# Patient Record
Sex: Male | Born: 1955 | Race: White | Hispanic: No | Marital: Single
Health system: Southern US, Community
[De-identification: ages and names within clinical notes are randomized; demographics above are authoritative.]

---

## 2014-03-21 ENCOUNTER — Other Ambulatory Visit: Payer: Self-pay | Admitting: Internal Medicine

## 2014-03-21 ENCOUNTER — Ambulatory Visit
Admission: RE | Admit: 2014-03-21 | Discharge: 2014-03-21 | Disposition: A | Discharge status: Home / Self Care | Payer: BC Managed Care – PPO | Source: Ambulatory Visit | Attending: Internal Medicine | Admitting: Internal Medicine

## 2014-03-21 DIAGNOSIS — M25511 Pain in right shoulder: Secondary | ICD-10-CM

## 2015-02-04 ENCOUNTER — Other Ambulatory Visit: Payer: Self-pay | Admitting: Internal Medicine

## 2015-02-04 DIAGNOSIS — R1084 Generalized abdominal pain: Secondary | ICD-10-CM

## 2015-02-10 ENCOUNTER — Other Ambulatory Visit: Payer: Self-pay

## 2015-07-10 IMAGING — CR DG SHOULDER 2+V*R*
3 series · 3 of 3 positions shown · non-contrast
Comparison: None.

CLINICAL DATA: RIGHT SHOULDER PAIN

EXAM:
RIGHT SHOULDER - 2+ VIEW

[view not recorded (1 of 3)]
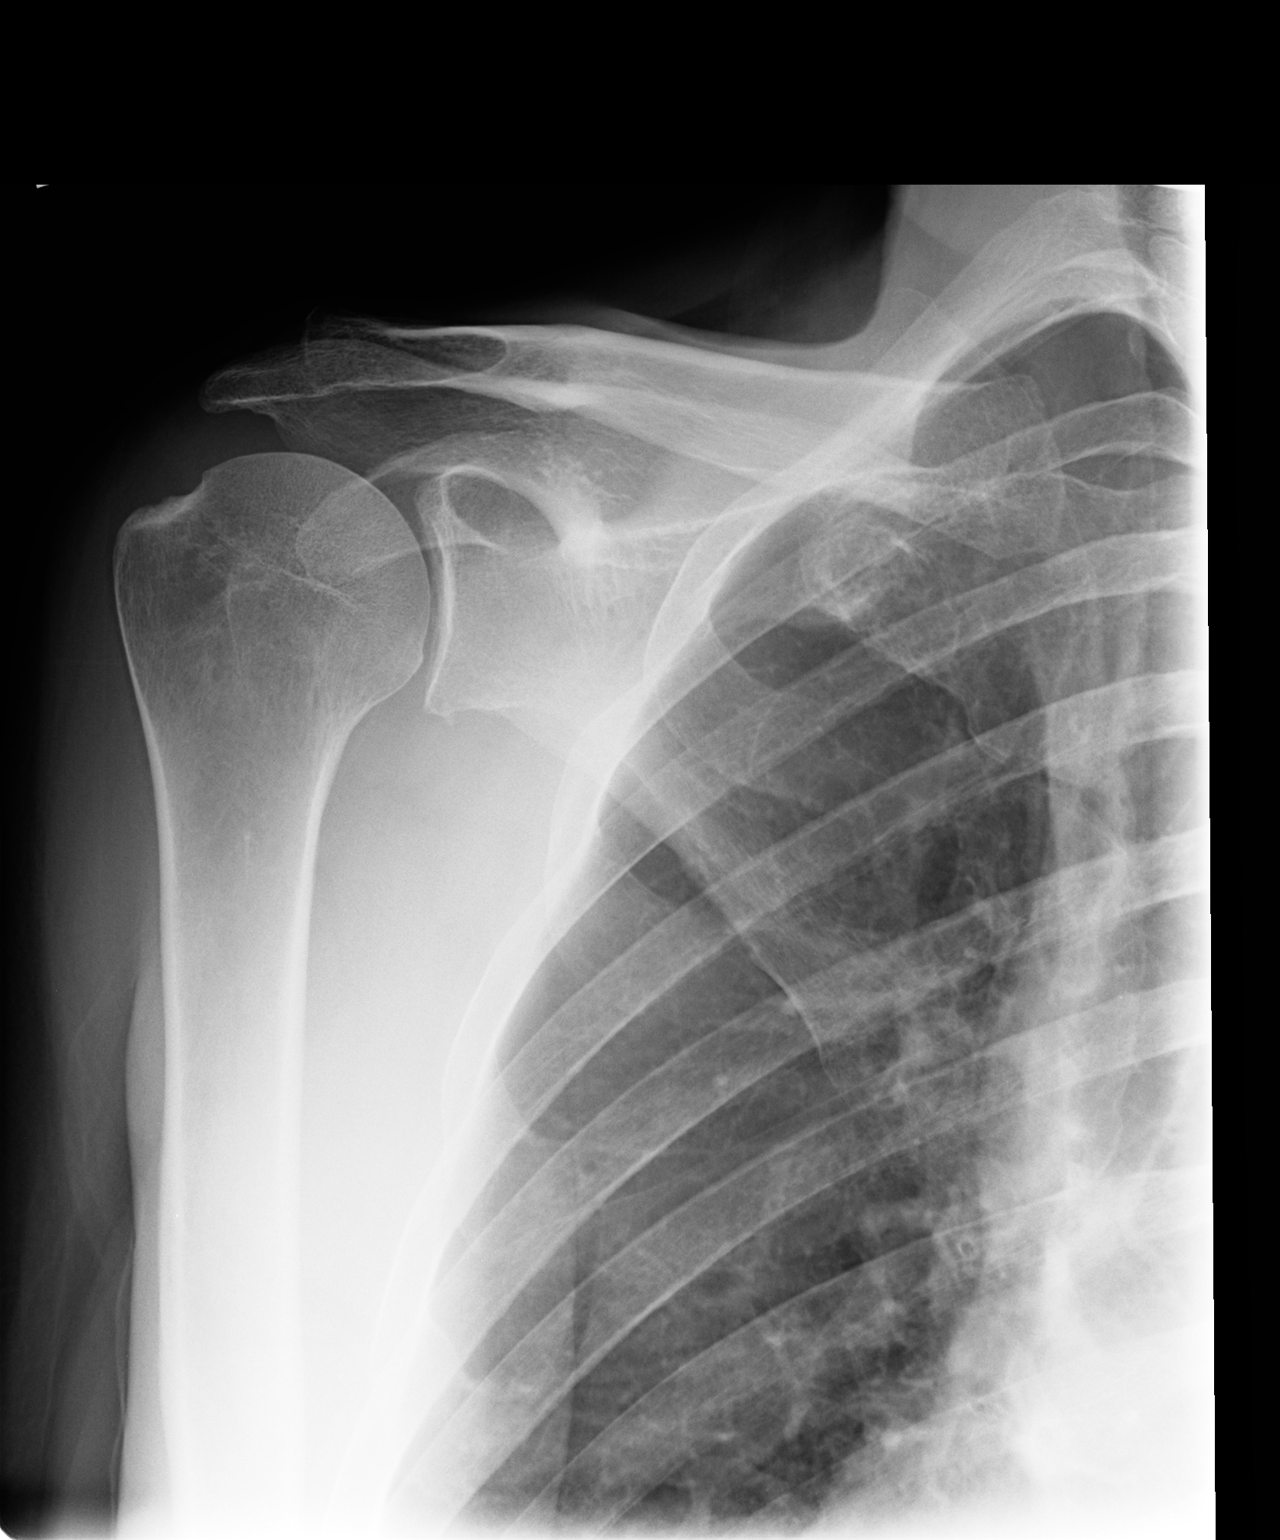

[view not recorded (2 of 3)]
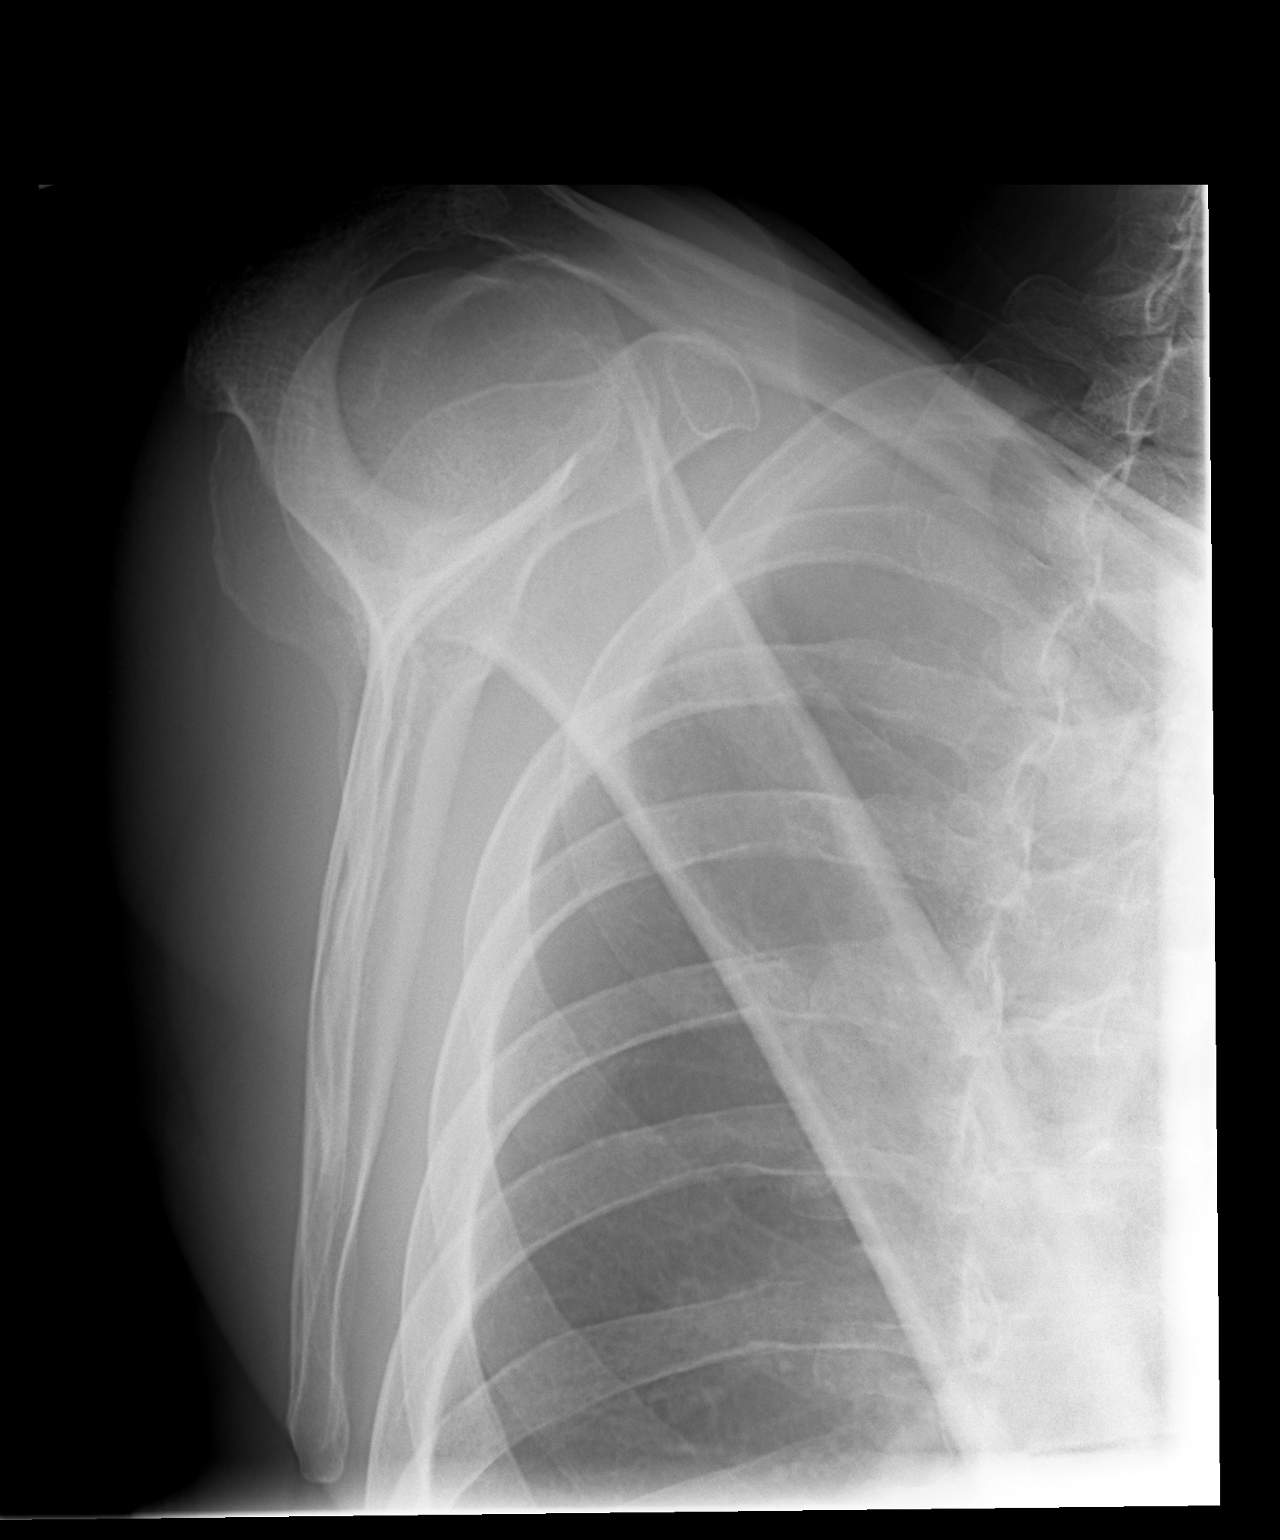

[view not recorded (3 of 3)]
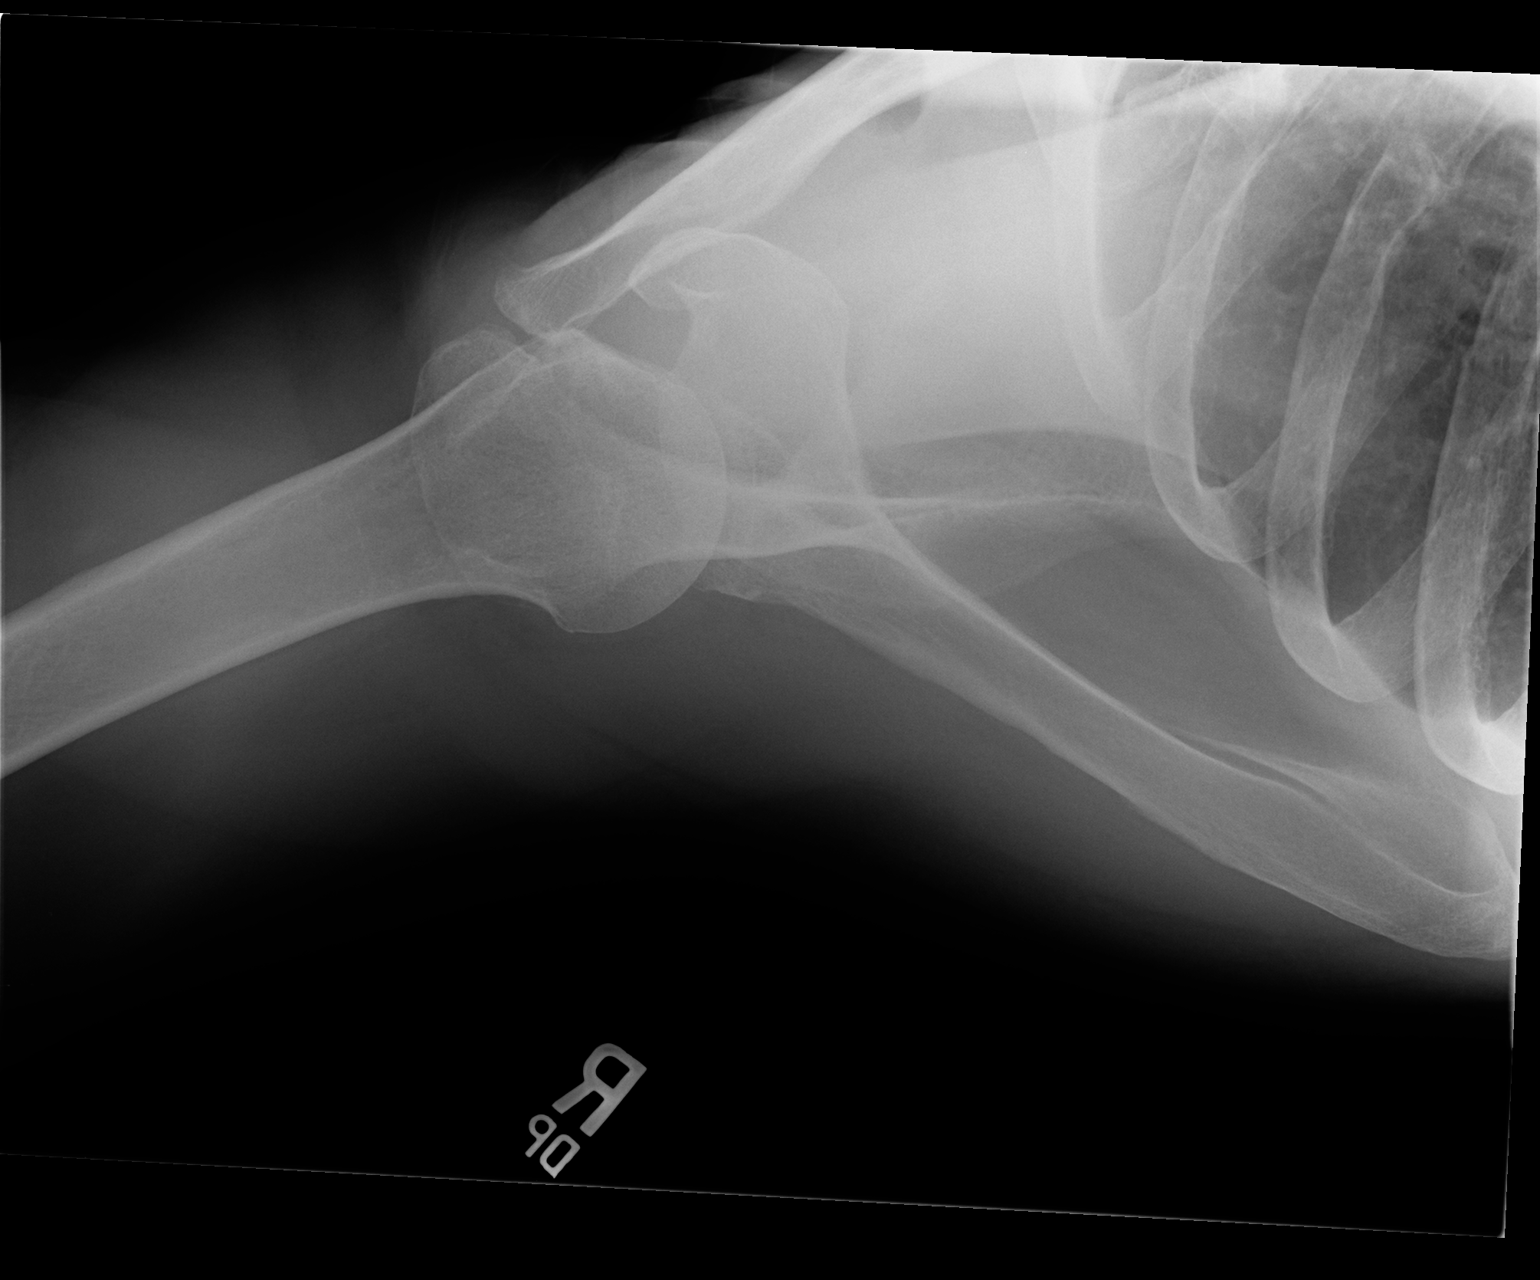

[3 of 3 positions shown; findings below may reference images not displayed]

FINDINGS: There is no evidence of fracture or dislocation. There is no
evidence of arthropathy or other focal bone abnormality. Soft
tissues are unremarkable.
IMPRESSION: Negative.
